# Patient Record
Sex: Male | Born: 2000 | Hispanic: Yes | Marital: Single
Health system: Southern US, Community
[De-identification: ages and names within clinical notes are randomized; demographics above are authoritative.]

---
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
## 2023-03-19 ENCOUNTER — Emergency Department
Admission: EM | Admit: 2023-03-19 | Discharge: 2023-03-19 | Disposition: A | Discharge status: Home / Self Care | Payer: Self-pay | Attending: Emergency Medicine | Admitting: Emergency Medicine

## 2023-03-19 ENCOUNTER — Other Ambulatory Visit: Payer: Self-pay

## 2023-03-19 ENCOUNTER — Emergency Department: Payer: Self-pay

## 2023-03-19 DIAGNOSIS — S0181XA Laceration without foreign body of other part of head, initial encounter: Secondary | ICD-10-CM | POA: Insufficient documentation

## 2023-03-19 DIAGNOSIS — Y9234 Swimming pool (public) as the place of occurrence of the external cause: Secondary | ICD-10-CM | POA: Insufficient documentation

## 2023-03-19 DIAGNOSIS — W500XXA Accidental hit or strike by another person, initial encounter: Secondary | ICD-10-CM | POA: Insufficient documentation

## 2023-03-19 DIAGNOSIS — Y9311 Activity, swimming: Secondary | ICD-10-CM | POA: Insufficient documentation

## 2023-03-19 DIAGNOSIS — S0083XA Contusion of other part of head, initial encounter: Secondary | ICD-10-CM

## 2023-03-19 DIAGNOSIS — S0121XA Laceration without foreign body of nose, initial encounter: Secondary | ICD-10-CM | POA: Insufficient documentation

## 2023-03-19 MED ORDER — LIDOCAINE-EPINEPHRINE-TETRACAINE (LET) TOPICAL GEL
3.0000 mL | Freq: Once | TOPICAL | Status: AC
  Administered 2023-03-19: 3 mL via TOPICAL
  Filled 2023-03-19: qty 3
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
